# Patient Record
Sex: Male | Born: 1995 | Race: Black or African American | Hispanic: No | Marital: Single | Smoking: Never smoker
Health system: Southern US, Community
[De-identification: ages and names within clinical notes are randomized; demographics above are authoritative.]

---
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
## 2015-05-29 ENCOUNTER — Emergency Department (HOSPITAL_COMMUNITY)
Admission: EM | Admit: 2015-05-29 | Discharge: 2015-05-29 | Disposition: A | Discharge status: Home / Self Care | Payer: BLUE CROSS/BLUE SHIELD | Attending: Emergency Medicine | Admitting: Emergency Medicine

## 2015-05-29 ENCOUNTER — Encounter (HOSPITAL_COMMUNITY): Payer: Self-pay | Admitting: Emergency Medicine

## 2015-05-29 DIAGNOSIS — N390 Urinary tract infection, site not specified: Secondary | ICD-10-CM | POA: Diagnosis not present

## 2015-05-29 DIAGNOSIS — R319 Hematuria, unspecified: Secondary | ICD-10-CM | POA: Diagnosis present

## 2015-05-29 LAB — URINALYSIS, ROUTINE W REFLEX MICROSCOPIC
Bilirubin Urine: NEGATIVE
Glucose, UA: NEGATIVE mg/dL
Ketones, ur: 15 mg/dL — AB
Nitrite: NEGATIVE
Protein, ur: 30 mg/dL — AB
Specific Gravity, Urine: 1.029 (ref 1.005–1.030)
Urobilinogen, UA: 1 mg/dL (ref 0.0–1.0)
pH: 6 (ref 5.0–8.0)

## 2015-05-29 LAB — I-STAT CHEM 8, ED
BUN: 15 mg/dL (ref 6–20)
Calcium, Ion: 1.3 mmol/L — ABNORMAL HIGH (ref 1.12–1.23)
Chloride: 101 mmol/L (ref 101–111)
Creatinine, Ser: 0.9 mg/dL (ref 0.61–1.24)
Glucose, Bld: 85 mg/dL (ref 65–99)
HCT: 47 % (ref 39.0–52.0)
Hemoglobin: 16 g/dL (ref 13.0–17.0)
Potassium: 3.7 mmol/L (ref 3.5–5.1)
Sodium: 142 mmol/L (ref 135–145)
TCO2: 25 mmol/L (ref 0–100)

## 2015-05-29 LAB — URINE MICROSCOPIC-ADD ON

## 2015-05-29 MED ORDER — NITROFURANTOIN MONOHYD MACRO 100 MG PO CAPS: 100.0000 mg | ORAL_CAPSULE | Freq: Two times a day (BID) | ORAL | Status: AC

## 2015-05-29 NOTE — ED Notes (Signed)
Patient transported to CT 

## 2015-05-29 NOTE — ED Provider Notes (Signed)
CSN: [id number]   Arrival date & time 05/29/15 1847  History  This chart was scribed for non-physician practitioner, Eyvonne Mechanic PA-C , working with Glynn Octave, MD by Bethel Born, ED Scribe. This patient was seen in room TR08C/TR08C and the patient's care was started at 8:00 PM.  Chief Complaint  Patient presents with  . Hematuria    HPI The history is provided by the patient. No language interpreter was used.   Mitchell Bennett is a 19 y.o. male who presents to the Emergency Department complaining of hematuria with onset this morning. He has had 2 episodes of bleeding. The first time he noticed blood when he shook the penis after urination. The second time he had more blood. Associated symptoms includes dysuria. Pt skates and falls frequently but denies any significantly hard falls recently or new back pain. Sexually active and variably uses protection. No fever, chills, nausea, vomiting, abdominal pain, difficulty with urination, or penile pain.   History reviewed. No pertinent past medical history.  History reviewed. No pertinent past surgical history.  No family history on file.  Social History  Substance Use Topics  . Smoking status: Passive Smoke Exposure - Never Smoker  . Smokeless tobacco: None  . Alcohol Use: No     Review of Systems  All other systems reviewed and are negative.   Home Medications   Prior to Admission medications   Medication Sig Start Date End Date Taking? Authorizing Provider  nitrofurantoin, macrocrystal-monohydrate, (MACROBID) 100 MG capsule Take 1 capsule (100 mg total) by mouth 2 (two) times daily. 05/29/15   Eyvonne Mechanic, PA-C    Allergies  Review of patient's allergies indicates no known allergies.  Triage Vitals: BP 142/75 mmHg  Pulse 75  Temp(Src) 97.5 F (36.4 C) (Oral)  Resp 14  Ht  (1.676 m)  Wt 160 lb (72.576 kg)  BMI 25.84 kg/m2  SpO2 98%  Physical Exam  Constitutional: He is oriented to person, place, and time. He  appears well-developed and well-nourished.  HENT:  Head: Normocephalic.  Eyes: EOM are normal.  Neck: Normal range of motion.  Pulmonary/Chest: Effort normal.  Abdominal: He exhibits no distension.  Musculoskeletal: Normal range of motion.  No CVA tenderness  Neurological: He is alert and oriented to person, place, and time.  Psychiatric: He has a normal mood and affect.  Nursing note and vitals reviewed.   ED Course  Procedures  Labs Review-  Labs Reviewed  URINALYSIS, ROUTINE W REFLEX MICROSCOPIC (NOT AT [facility]) - Abnormal; Notable for the following:    APPearance CLOUDY (*)    Hgb urine dipstick LARGE (*)    Ketones, ur 15 (*)    Protein, ur 30 (*)    Leukocytes, UA MODERATE (*)    All other components within normal limits  URINE MICROSCOPIC-ADD ON - Abnormal; Notable for the following:    Squamous Epithelial / LPF MANY (*)    Bacteria, UA FEW (*)    All other components within normal limits  I-STAT CHEM 8, ED - Abnormal; Notable for the following:    Calcium, Ion 1.30 (*)    All other components within normal limits  RPR  HIV ANTIBODY (ROUTINE TESTING)  GC/CHLAMYDIA PROBE AMP ([place]) NOT AT [facility]    Imaging Review No results found.  EKG Interpretation None    MDM  10:05 PM I re-evaluated the patient and provided an update on the results of his lab work.    Final diagnoses:  Urinary tract  infection with hematuria, site unspecified     Labs:UA, HIV antibody, RPR, I-stat chem 8- large hemoglobin, moderate leukocytes  Imaging:   Consults   Therapeutics: Nitrofurantoin  Assessment: Patient presents with blood in his urine. He reports this was evident with his first urination this morning, has not noticed that since then. Patient denies any urinary complaints this time. Patient did have blood in his urine, with other findings that indicate infection. Due to patient's sexual activity, I suggested prophylactic treatment for STDs with Rocephin and  azithromycin, and treatment for urinary tract infection. Patient reported that he would not accept treatment for STDs, would accept treatment for urinary tract infection. I informed him that he would benefit him to have reflected treatment at this time, but he would be contacted with any positive test results and directed for treatment at that time. He was given strict return precautions event new or worsening signs or symptoms presented. Patient was instructed follow-up with his primary care provider in 2 days for reevaluation. Patient verbalized understanding and agreement for today's plan and had no further questions or concerns at the time of discharge  Plan:  I personally performed the services described in this documentation, which was scribed in my presence. The recorded information has been reviewed and is accurate.      Eyvonne Mechanic, PA-C 05/30/15 1512  Glynn Octave, MD 05/30/15 1535

## 2015-05-29 NOTE — ED Notes (Signed)
Pt. reports hematuria and mild dysuria onset today , denies penile discharge , no fever or chills.

## 2015-05-29 NOTE — Discharge Instructions (Signed)
Please use and Robaxin as directed, please follow-up with your primary care provider in 3 days for reevaluation. Please return immediately if new or worsening signs or symptoms present. We contacted with your test results if they should be positive.

## 2015-05-30 LAB — HIV ANTIBODY (ROUTINE TESTING W REFLEX): HIV Screen 4th Generation wRfx: NONREACTIVE

## 2015-05-30 LAB — RPR: RPR Ser Ql: NONREACTIVE

## 2015-05-31 LAB — GC/CHLAMYDIA PROBE AMP (~~LOC~~) NOT AT ARMC
Chlamydia: POSITIVE — AB
Neisseria Gonorrhea: NEGATIVE

## 2015-06-01 ENCOUNTER — Telehealth (HOSPITAL_COMMUNITY): Payer: Self-pay

## 2015-06-01 NOTE — Telephone Encounter (Signed)
Results received from [place].  (+) Chlamydia  No antibiotic treatment or prescription given for STD.  Chart to MD office for review.  [facility] form attached. 

## 2015-06-03 ENCOUNTER — Telehealth (HOSPITAL_COMMUNITY): Payer: Self-pay | Admitting: Emergency Medicine

## 2015-06-03 NOTE — Telephone Encounter (Signed)
Post ED Visit - Positive Culture Follow-up: Successful Patient Follow-Up  Positive Chlamydia culture   Patient discharged without antimicrobial prescription and treatment is now indicated  Organism is resistant to prescribed ED discharge antimicrobial  Patient with positive blood cultures  Changes discussed with ED provider: Mirian Mo, MD New antibiotic prescription: Azithromycin one gram PO x once  Will contact patient   Mitchell Bennett 06/03/2015, 5:37 PM

## 2015-06-04 ENCOUNTER — Telehealth (HOSPITAL_COMMUNITY): Payer: Self-pay | Admitting: *Deleted

## 2015-06-05 ENCOUNTER — Telehealth (HOSPITAL_BASED_OUTPATIENT_CLINIC_OR_DEPARTMENT_OTHER): Payer: Self-pay | Admitting: Emergency Medicine

## 2015-06-28 ENCOUNTER — Telehealth (HOSPITAL_COMMUNITY): Payer: Self-pay

## 2015-06-28 NOTE — Telephone Encounter (Signed)
Unable to contact pt by mail or telephone. Unable to communicate lab results or treatment changes. 

## 2017-01-10 ENCOUNTER — Encounter (HOSPITAL_COMMUNITY): Payer: Self-pay | Admitting: *Deleted

## 2017-01-10 ENCOUNTER — Ambulatory Visit (HOSPITAL_COMMUNITY)
Admission: EM | Admit: 2017-01-10 | Discharge: 2017-01-10 | Disposition: A | Discharge status: Home / Self Care | Payer: Medicaid Other | Attending: Internal Medicine | Admitting: Internal Medicine

## 2017-01-10 DIAGNOSIS — A084 Viral intestinal infection, unspecified: Secondary | ICD-10-CM

## 2017-01-10 NOTE — Discharge Instructions (Signed)
Go to ER if you starts to throw up blood again. Otherwise I believe you will be fine. Stay well hydrated.

## 2017-01-10 NOTE — ED Triage Notes (Signed)
Pt   Reports     Several  Days  Of   Some  Abdominal  Pain   Had  Some  Vomiting   Which  Was  Blood  Tinged   As   Well  As  Diarrhea   The   Symptoms  Let  Up  And  He  Has  None  Today      He  Is  Able  To  Hold  Down liquids

## 2017-01-10 NOTE — ED Provider Notes (Signed)
CSN: [id number]     Arrival date & time 01/10/17  1552 History   First MD Initiated Contact with Patient 01/10/17 1648     Chief Complaint  Patient presents with  . Abdominal Pain   (Consider location/radiation/quality/duration/timing/severity/associated sxs/prior Treatment) Patient is a healthy 21 y.o. Male, is here for one episode of hematemesis on Thursday night. He reports that he got sick on Thursday with abdominal pain, nausea, vomiting and diarrhea. He reports he had several episodes of vomiting but only 1 episode where it looked "red and bloody" but the condition resolved spontaneously with no subsequent episodes. He denies eating or drinking anything that is brown or red. He denies blood clots in the emesis.  He reports feeling better today. He is still nauseous but tolerable. Diarrhea also resolved. He denies dizziness, fatigue, SOB, CP, or headache.   Zofran was offered but patient declined.         History reviewed. No pertinent past medical history. History reviewed. No pertinent surgical history. History reviewed. No pertinent family history. Social History  Substance Use Topics  . Smoking status: Passive Smoke Exposure - Never Smoker  . Smokeless tobacco: Not on file  . Alcohol use No    Review of Systems  Constitutional:       As stated in the HPI    Allergies  Patient has no known allergies.  Home Medications   Prior to Admission medications   Medication Sig Start Date End Date Taking? Authorizing Provider  nitrofurantoin, macrocrystal-monohydrate, (MACROBID) 100 MG capsule Take 1 capsule (100 mg total) by mouth 2 (two) times daily. 05/29/15   Eyvonne MechanicJeffrey Hedges, PA-C   Meds Ordered and Administered this Visit  Medications - No data to display  BP 120/72 (BP Location: Right Arm)   Pulse 80   Temp 98.6 F (37 C) (Oral)   Resp 18   SpO2 100%  No data found.   Physical Exam  Constitutional: He is oriented to person, place, and time. He appears  well-developed and well-nourished.  HENT:  Head: Normocephalic and atraumatic.  Eyes: Pupils are equal, round, and reactive to light.  Neck: Normal range of motion.  Cardiovascular: Normal rate, regular rhythm and normal heart sounds.   Pulmonary/Chest: Effort normal and breath sounds normal.  Abdominal: Soft. Bowel sounds are normal. He exhibits no distension. There is no tenderness.  Neurological: He is oriented to person, place, and time.  Skin: Skin is warm and dry. No pallor.  Nursing note and vitals reviewed.   Urgent Care Course     Procedures (including critical care time)  Labs Review Labs Reviewed - No data to display  Imaging Review No results found.   MDM   1. Viral gastroenteritis    Physical examination unremarkable with no red flags noted. Patient appears well. Vital Signs are appropriate. Patient most likely had an viral gastroenteritis that is improving. Educated to stay well hydrated. Zofran rx was declined by the patient. I feel comfortable sending patent home but strict ER precaution were discussed.     Lucia EstelleFeng Floy Angert, NP 01/10/17 1708

## 2017-02-21 ENCOUNTER — Ambulatory Visit (INDEPENDENT_AMBULATORY_CARE_PROVIDER_SITE_OTHER): Payer: Self-pay

## 2017-02-21 ENCOUNTER — Ambulatory Visit (HOSPITAL_COMMUNITY)
Admission: EM | Admit: 2017-02-21 | Discharge: 2017-02-21 | Disposition: A | Discharge status: Home / Self Care | Payer: Self-pay | Attending: Internal Medicine | Admitting: Internal Medicine

## 2017-02-21 ENCOUNTER — Encounter (HOSPITAL_COMMUNITY): Payer: Self-pay | Admitting: *Deleted

## 2017-02-21 DIAGNOSIS — S60221A Contusion of right hand, initial encounter: Secondary | ICD-10-CM

## 2017-02-21 DIAGNOSIS — W228XXA Striking against or struck by other objects, initial encounter: Secondary | ICD-10-CM

## 2017-02-21 MED ORDER — IBUPROFEN 800 MG PO TABS: 800.0000 mg | ORAL_TABLET | Freq: Three times a day (TID) | ORAL | 0 refills | Status: AC

## 2017-02-21 NOTE — Discharge Instructions (Signed)
Apply ice, elevate, any changes in blood flow go to the nearest emergency room

## 2017-02-21 NOTE — ED Provider Notes (Signed)
CSN: [id number]     Arrival date & time 02/21/17  1757 History   First MD Initiated Contact with Patient 02/21/17 1852     Chief Complaint  Patient presents with  . Hand Injury   (Consider location/radiation/quality/duration/timing/severity/associated sxs/prior Treatment) [age] y.o. male presents with injury to right hand that occurred today when he punched a wall. Patient states that he dented the wall and hit the stud. Condition is acute  in nature. Condition is made better by nothing. Condition is made worse by nothing. Patient denies any treatmen prior to there arrival at this facility. Cap refill < 2, skin warm and dry. Patient has full ROM with increased pain when closing his fist. Patient has a abrasion over middle PIP. Swelling noted to posterior        History reviewed. No pertinent past medical history. History reviewed. No pertinent surgical history. History reviewed. No pertinent family history. Social History  Substance Use Topics  . Smoking status: Never Smoker  . Smokeless tobacco: Not on file  . Alcohol use No    Review of Systems  Allergies  Patient has no known allergies.  Home Medications   Prior to Admission medications   Medication Sig Start Date End Date Taking? Authorizing Provider  nitrofurantoin, macrocrystal-monohydrate, (MACROBID) 100 MG capsule Take 1 capsule (100 mg total) by mouth 2 (two) times daily. 05/29/15   Eyvonne MechanicHedges, Jeffrey, PA-C   Meds Ordered and Administered this Visit  Medications - No data to display  BP (!) 143/80   Pulse 63   Temp 97.9 F (36.6 C) (Oral)   Resp 16   SpO2 96%  No data found.   Physical Exam  Urgent Care Course     Procedures (including critical care time)  Labs Review Labs Reviewed - No data to display  Imaging Review No results found.       MDM  No diagnosis found.     Alene Miresmohundro, Anne Boltz C, NP 02/21/17 1931

## 2017-02-21 NOTE — ED Triage Notes (Signed)
Reports punching wall with right hand earlier today, hitting a stud behind drywall.  Right had swollen; c/o "clicking noise" in proximal hand with movement.  All fingers warm, pink, with prompt cap refill.

## 2017-03-28 ENCOUNTER — Encounter (HOSPITAL_COMMUNITY): Payer: Self-pay | Admitting: Emergency Medicine

## 2017-03-28 ENCOUNTER — Emergency Department (HOSPITAL_COMMUNITY)
Admission: EM | Admit: 2017-03-28 | Discharge: 2017-03-29 | Disposition: A | Discharge status: Home / Self Care | Payer: Self-pay | Attending: Physician Assistant | Admitting: Physician Assistant

## 2017-03-28 DIAGNOSIS — W2201XD Walked into wall, subsequent encounter: Secondary | ICD-10-CM | POA: Insufficient documentation

## 2017-03-28 DIAGNOSIS — M79641 Pain in right hand: Secondary | ICD-10-CM

## 2017-03-28 DIAGNOSIS — S6991XD Unspecified injury of right wrist, hand and finger(s), subsequent encounter: Secondary | ICD-10-CM | POA: Insufficient documentation

## 2017-03-28 NOTE — ED Triage Notes (Signed)
Pt c.o 8/10 right hand pain, pt states he was seen on UC 3 weeks ago for a hand injury after he punch a wall, no fx showing on the x ray, but pt states hand is getting more swollen and painful. No deformity noticed.

## 2017-03-29 ENCOUNTER — Emergency Department (HOSPITAL_COMMUNITY): Payer: Self-pay

## 2017-03-29 NOTE — ED Notes (Signed)
Patient transported to X-ray 

## 2017-03-29 NOTE — ED Notes (Signed)
Pt departed in NAD, refused use of wheelchair.  

## 2017-03-29 NOTE — Discharge Instructions (Signed)
Used brace or Ace wrap on affected area. Continue ibuprofen or Aleve as needed for swelling and pain. Follow-up with hand specialist listed below for further evaluation. Return to ED for additional injury, numbness, weakness of extremity, warmth of the area, fevers.

## 2017-03-29 NOTE — ED Provider Notes (Signed)
[facility]-EMERGENCY DEPT Provider Note   CSN: [id number] Arrival date & time: 03/28/17  2155  By signing my name below, I, Linna Darner, attest that this documentation has been prepared under the direction and in the presence of Rhyan Wolters, PA-C. Electronically Signed: Linna Darner, Scribe. 03/29/2017. 12:11 AM.  History   Chief Complaint Chief Complaint  Patient presents with  . Hand Pain    right   The history is provided by the patient. No language interpreter was used.    HPI Comments: Mitchell Bennett is a 21 y.o. male who presents to the Emergency Department complaining of persistent dorsal right hand pain and swelling for three weeks. Patient reports some associated bruising. He states he punched a wall three weeks ago and was evaluated at Urgent Care the following day. Patient's right hand x-ray was negative and he was discharged with a brace and ibuprofen. He has worn the brace and completed the prescription of ibuprofen as instructed without improvement of his symptoms. He has not been taking ibuprofen or any other medications over the last few days. Patient notes he recently fell on his right hand while skateboarding and his pain has been worse since that time. He states he cannot make a full fist with his right hand secondary to pain. No h/o right hand fractures or injuries. Patient denies numbness/tingling, open wounds, Temperature changes, fever, drainage or bleeding from site or any other associated symptoms.  History reviewed. No pertinent past medical history.  There are no active problems to display for this patient.   History reviewed. No pertinent surgical history.     Home Medications    Prior to Admission medications   Medication Sig Start Date End Date Taking? Authorizing Provider  ibuprofen (ADVIL,MOTRIN) 800 MG tablet Take 1 tablet (800 mg total) by mouth 3 (three) times daily. 02/21/17   Alene Mires, NP  nitrofurantoin, macrocrystal-monohydrate,  (MACROBID) 100 MG capsule Take 1 capsule (100 mg total) by mouth 2 (two) times daily. 05/29/15   Eyvonne Mechanic, PA-C    Family History History reviewed. No pertinent family history.  Social History Social History  Substance Use Topics  . Smoking status: Never Smoker  . Smokeless tobacco: Not on file  . Alcohol use No     Allergies   Patient has no known allergies.   Review of Systems Review of Systems  Constitutional: Negative for chills and fever.  Musculoskeletal: Positive for arthralgias and joint swelling.  Skin: Positive for color change. Negative for wound.  Neurological: Negative for syncope and numbness.   Physical Exam Updated Vital Signs BP (!) 145/93 (BP Location: Right Arm)   Pulse 76   Temp 98 F (36.7 C)   Resp 20   Ht 5\' 7"  (1.702 m)   Wt 74.8 kg (165 lb)   SpO2 99%   BMI 25.84 kg/m   Physical Exam  Constitutional: He appears well-developed and well-nourished. No distress.  HENT:  Head: Normocephalic and atraumatic.  Eyes: Conjunctivae and EOM are normal. No scleral icterus.  Neck: Normal range of motion.  Pulmonary/Chest: Effort normal. No respiratory distress.  Musculoskeletal:  Tenderness to palpation of the third and fourth MCP joints on the right. Mild swelling present. Pain with flexion of the digits. No palpable deformity. No temperature change or color change noted. Full active ROM of the wrist.   Neurological: He is alert.  Skin: No rash noted. He is not diaphoretic.  Psychiatric: He has a normal mood and affect.  Nursing note  and vitals reviewed.     ED Treatments / Results  Labs (all labs ordered are listed, but only abnormal results are displayed) Labs Reviewed - No data to display  EKG  EKG Interpretation None       Radiology Dg Hand Complete Right  Result Date: 03/29/2017 CLINICAL DATA:  Right hand pain after injury. Punched a wall 1 month prior. Fall several days ago with increased pain. EXAM: RIGHT HAND - COMPLETE  3+ VIEW COMPARISON:  Radiographs 02/21/2017 FINDINGS: There is no evidence of fracture or dislocation. There is no evidence of arthropathy or other focal bone abnormality. Soft tissues are unremarkable. IMPRESSION: Negative radiographs of the right hand. Electronically Signed   By: Rubye OaksMelanie  Ehinger M.D.   On: 03/29/2017 01:06    Procedures Procedures (including critical care time)  DIAGNOSTIC STUDIES: Oxygen Saturation is 99% on RA, normal by my interpretation.    COORDINATION OF CARE: 12:09 AM Discussed treatment plan with pt at bedside and pt agreed to plan.  Medications Ordered in ED Medications - No data to display   Initial Impression / Assessment and Plan / ED Course  I have reviewed the triage vital signs and the nursing notes.  Pertinent labs & imaging results that were available during my care of the patient were reviewed by me and considered in my medical decision making (see chart for details).     Patient presents to the ED for right hand pain. He states that the symptoms first began 3 weeks ago when he punched a wall. He states that he was seen at urgent care and told he had negative x-rays, given a brace and told to take ibuprofen. He states that symptoms have persisted since then. 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  X-rays obtained here were negative for fracture dislocation. Area is mildly swollen with some tenderness to palpation but no concern for septic joint at this time. There is no temperature or color change noted in the area. Patient is afebrile with no history of fever. He has full active and passive range of motion of the fingers although some pain with making a full fist. I advised him that he should continue wearing the brace or Ace wrap the area and continue taking ibuprofen or Aleve as needed for pain. I advised him that he needs to follow-up with a hand specialist whose  information we will provide today for further evaluation. No need for further imaging or testing at this time. Strict return precautions given.  Final Clinical Impressions(s) / ED Diagnoses   Final diagnoses:  Right hand pain    New Prescriptions Discharge Medication List as of 03/29/2017  1:19 AM     I personally performed the services described in this documentation, which was scribed in my presence. The recorded information has been reviewed and is accurate.    Dietrich PatesKhatri, Elanore Talcott, PA-C 03/29/17 0159    Abelino DerrickMackuen, Courteney Lyn, MD 03/30/17 [phone]

## 2018-02-05 IMAGING — DX DG HAND COMPLETE 3+V*R*
3 series · 3 of 3 positions shown · non-contrast
Comparison: None.

CLINICAL DATA: Acute right hand pain following punching injury
today. Initial encounter.

EXAM:
RIGHT HAND - COMPLETE 3+ VIEW

[hand pa]
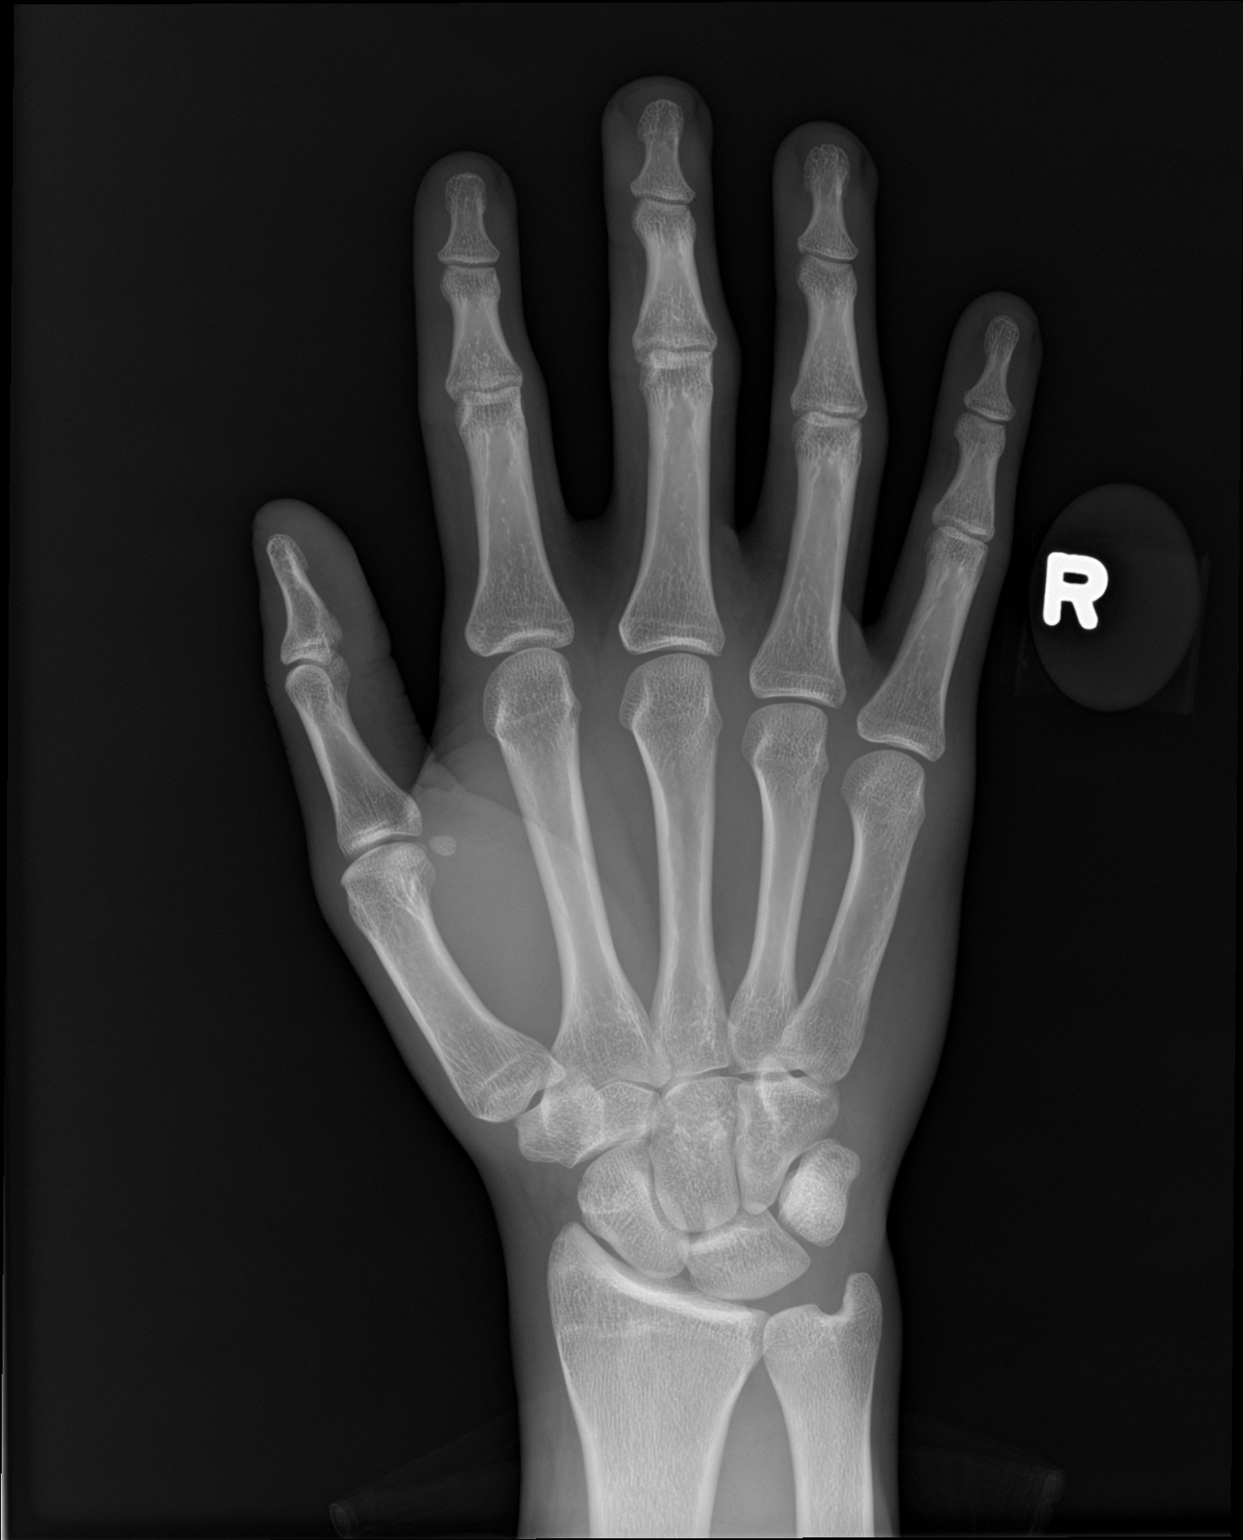

[hand obl]
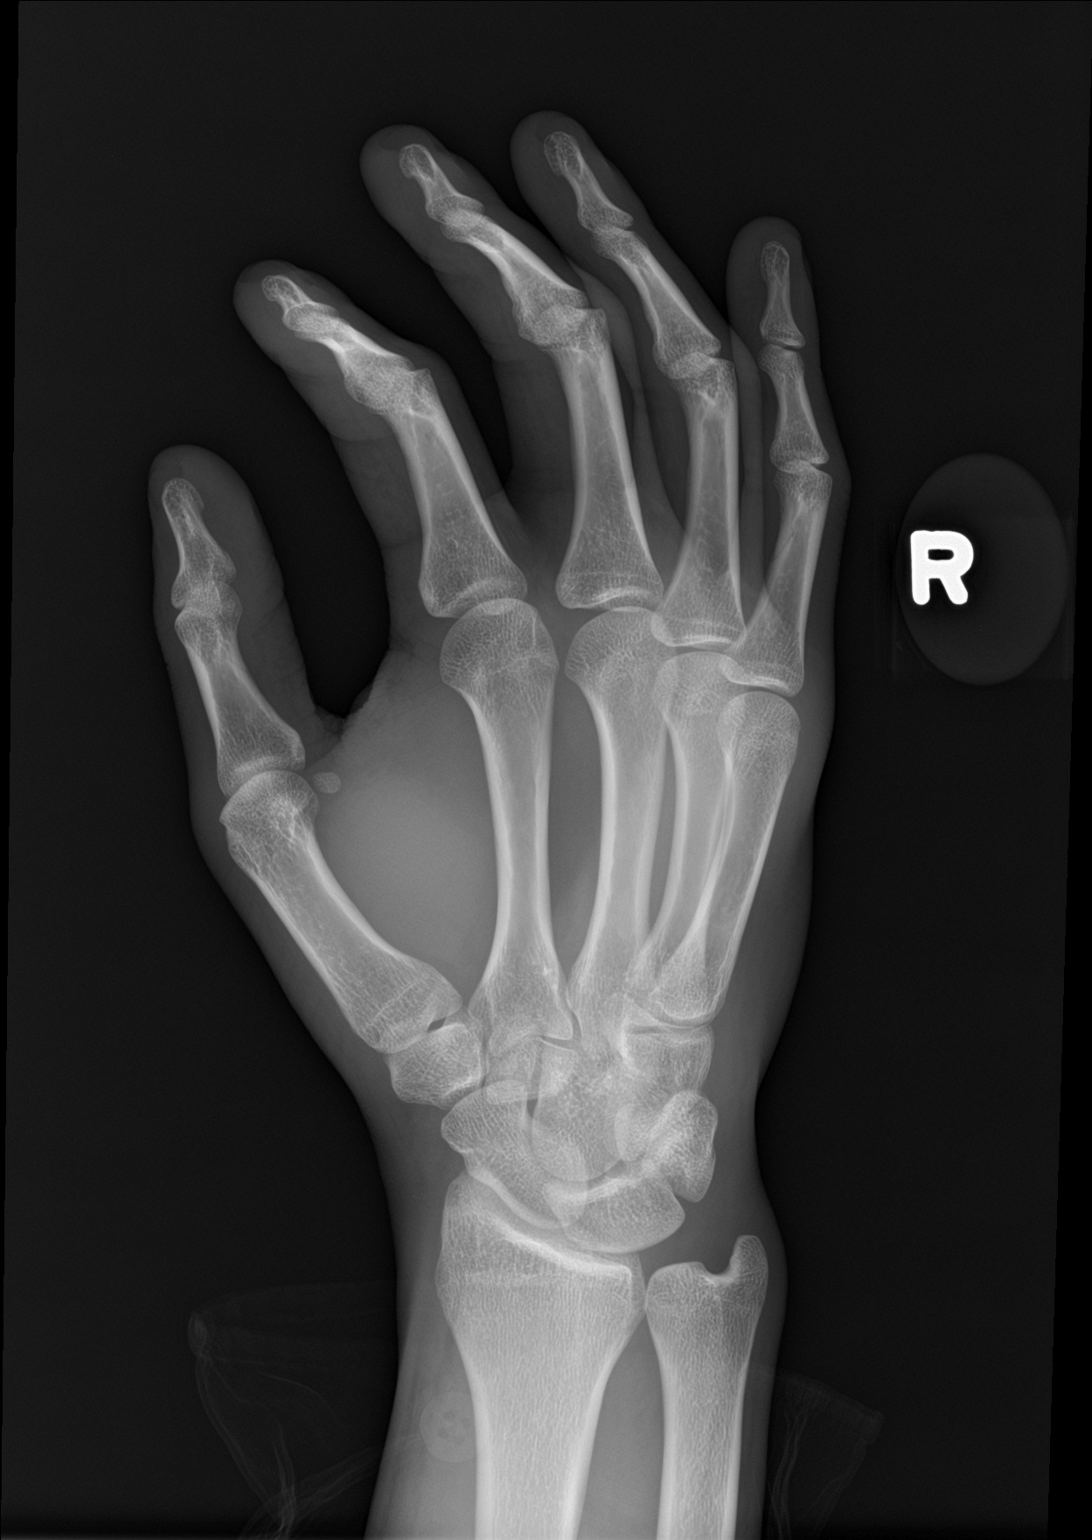

[hand lat]
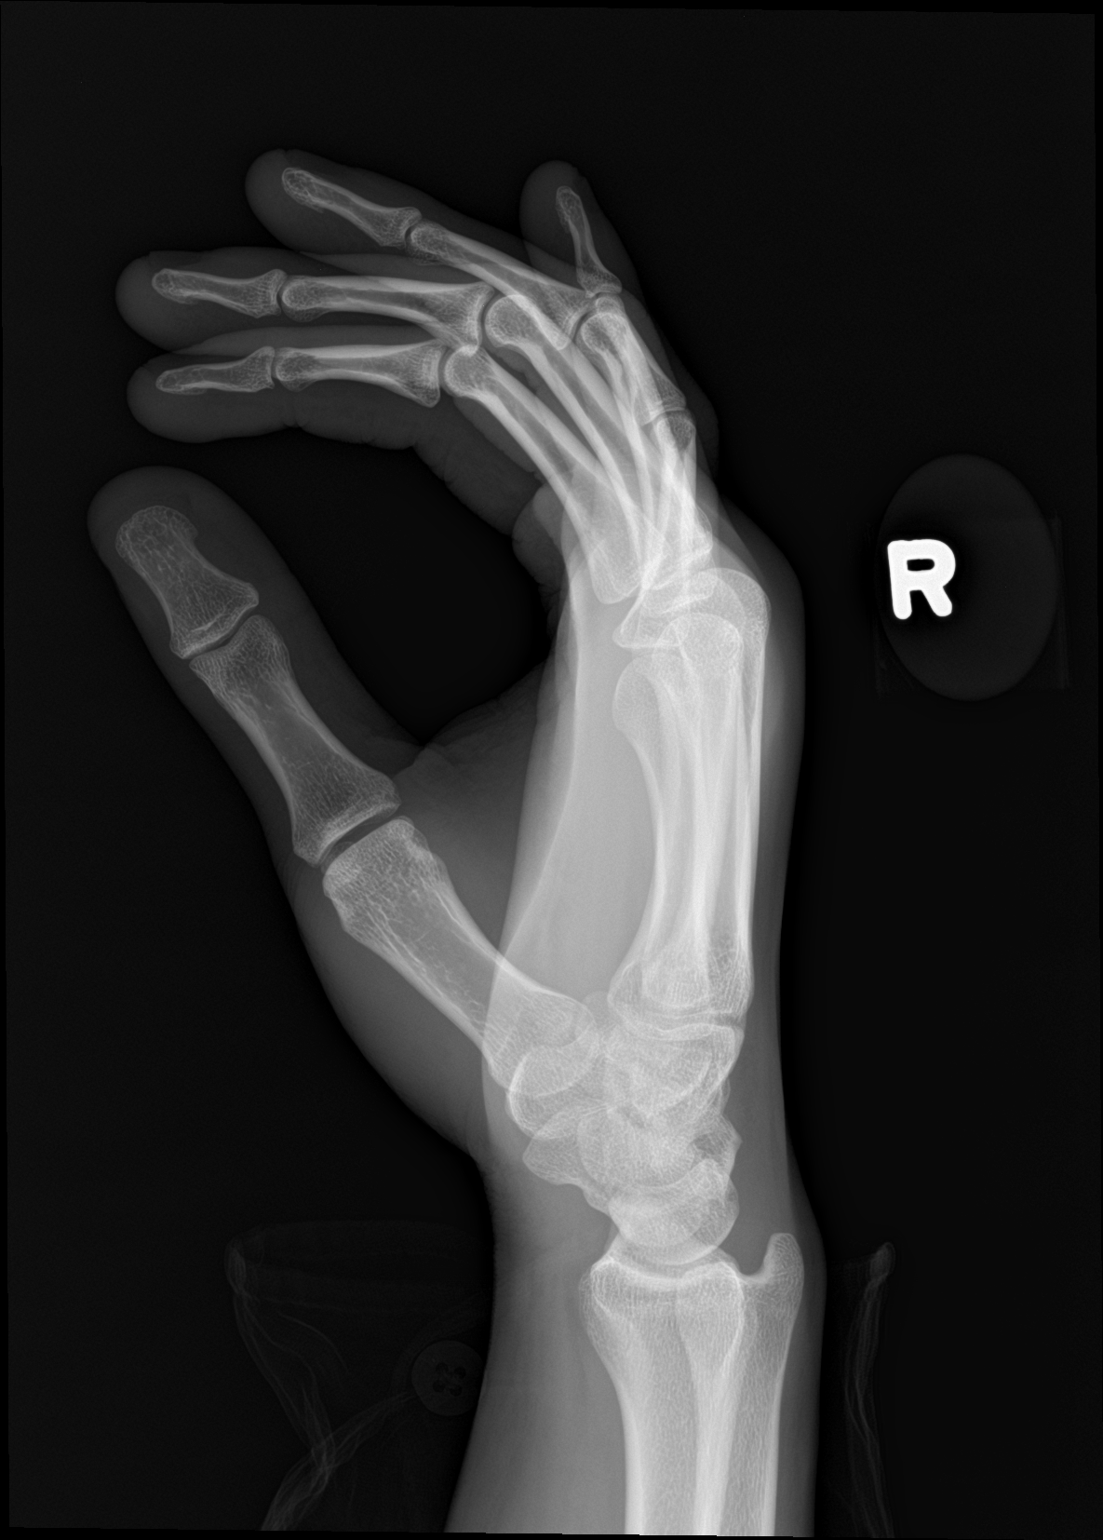

[3 of 3 positions shown; findings below may reference images not displayed]

FINDINGS: There is no evidence of acute fracture, subluxation or dislocation.

Mild dorsal soft tissue swelling is present.

No focal bony abnormalities are noted.
IMPRESSION: Mild soft tissue swelling without acute bony abnormality.

## 2018-03-13 IMAGING — DX DG HAND COMPLETE 3+V*R*
3 series · 3 of 3 positions shown · non-contrast
Comparison: Radiographs 02/21/2017

CLINICAL DATA: Right hand pain after injury. Punched a wall 1 month
prior. Fall several days ago with increased pain.

EXAM:
RIGHT HAND - COMPLETE 3+ VIEW

[hand pa]
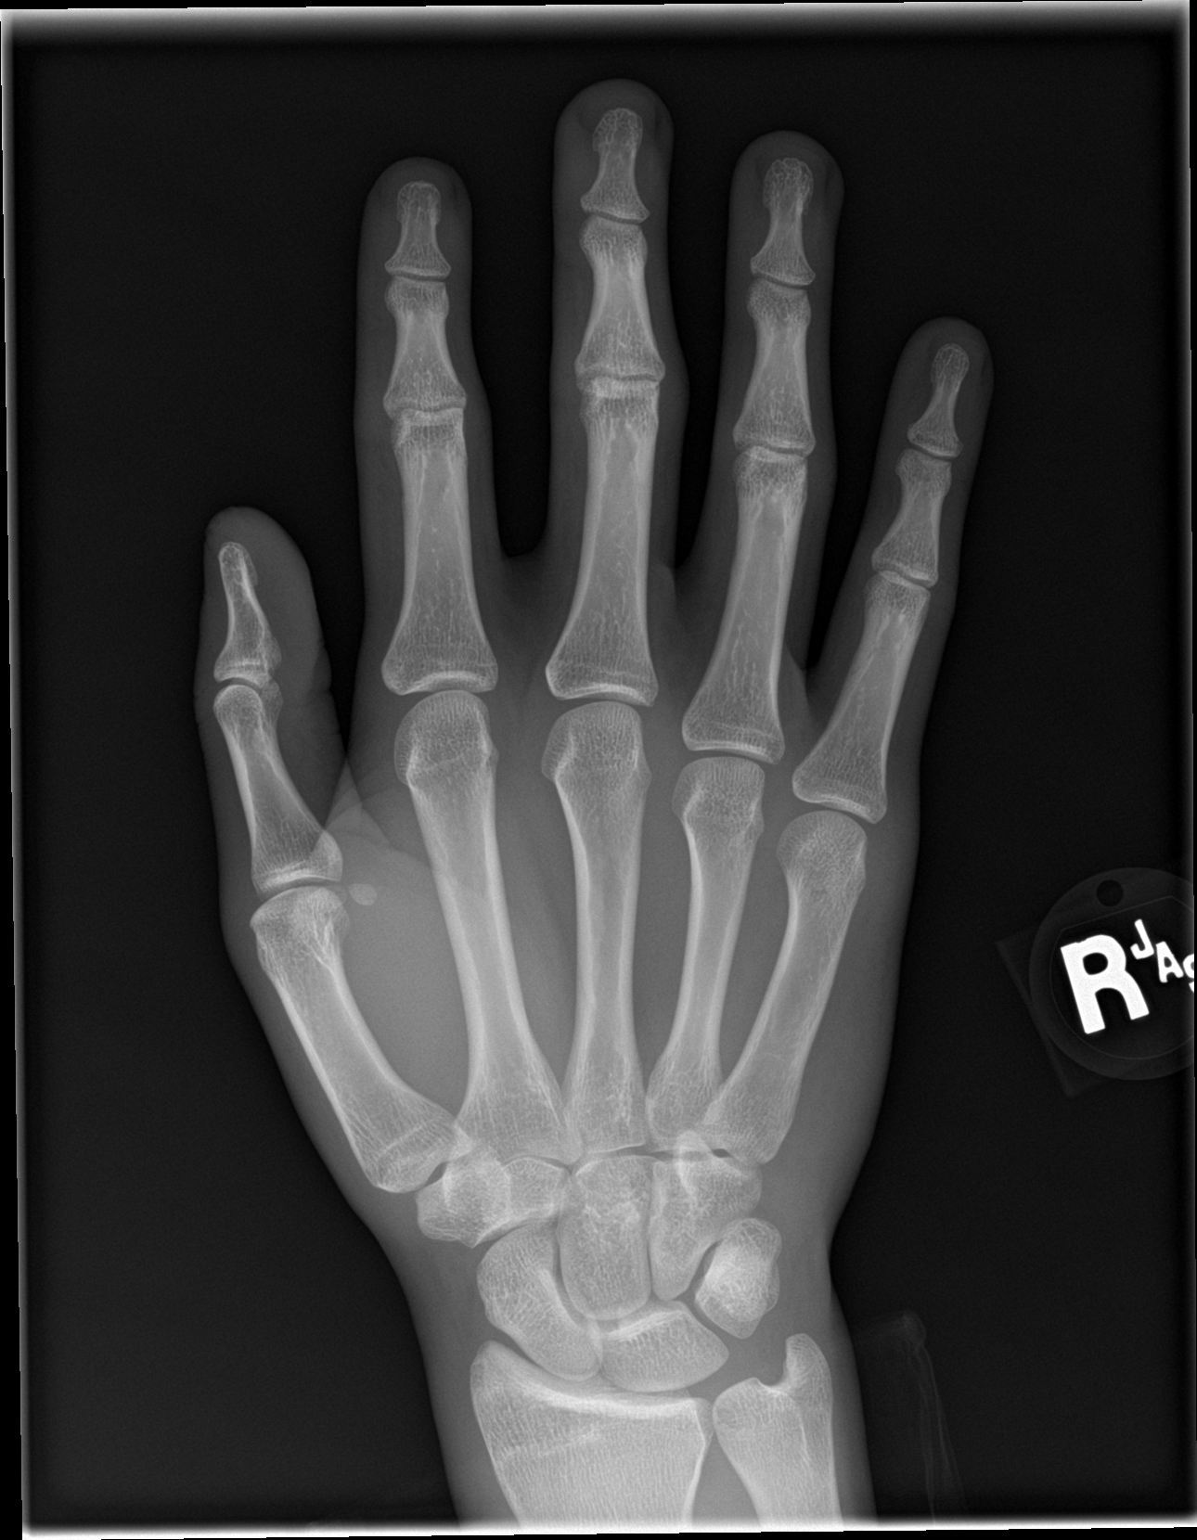

[hand obl]
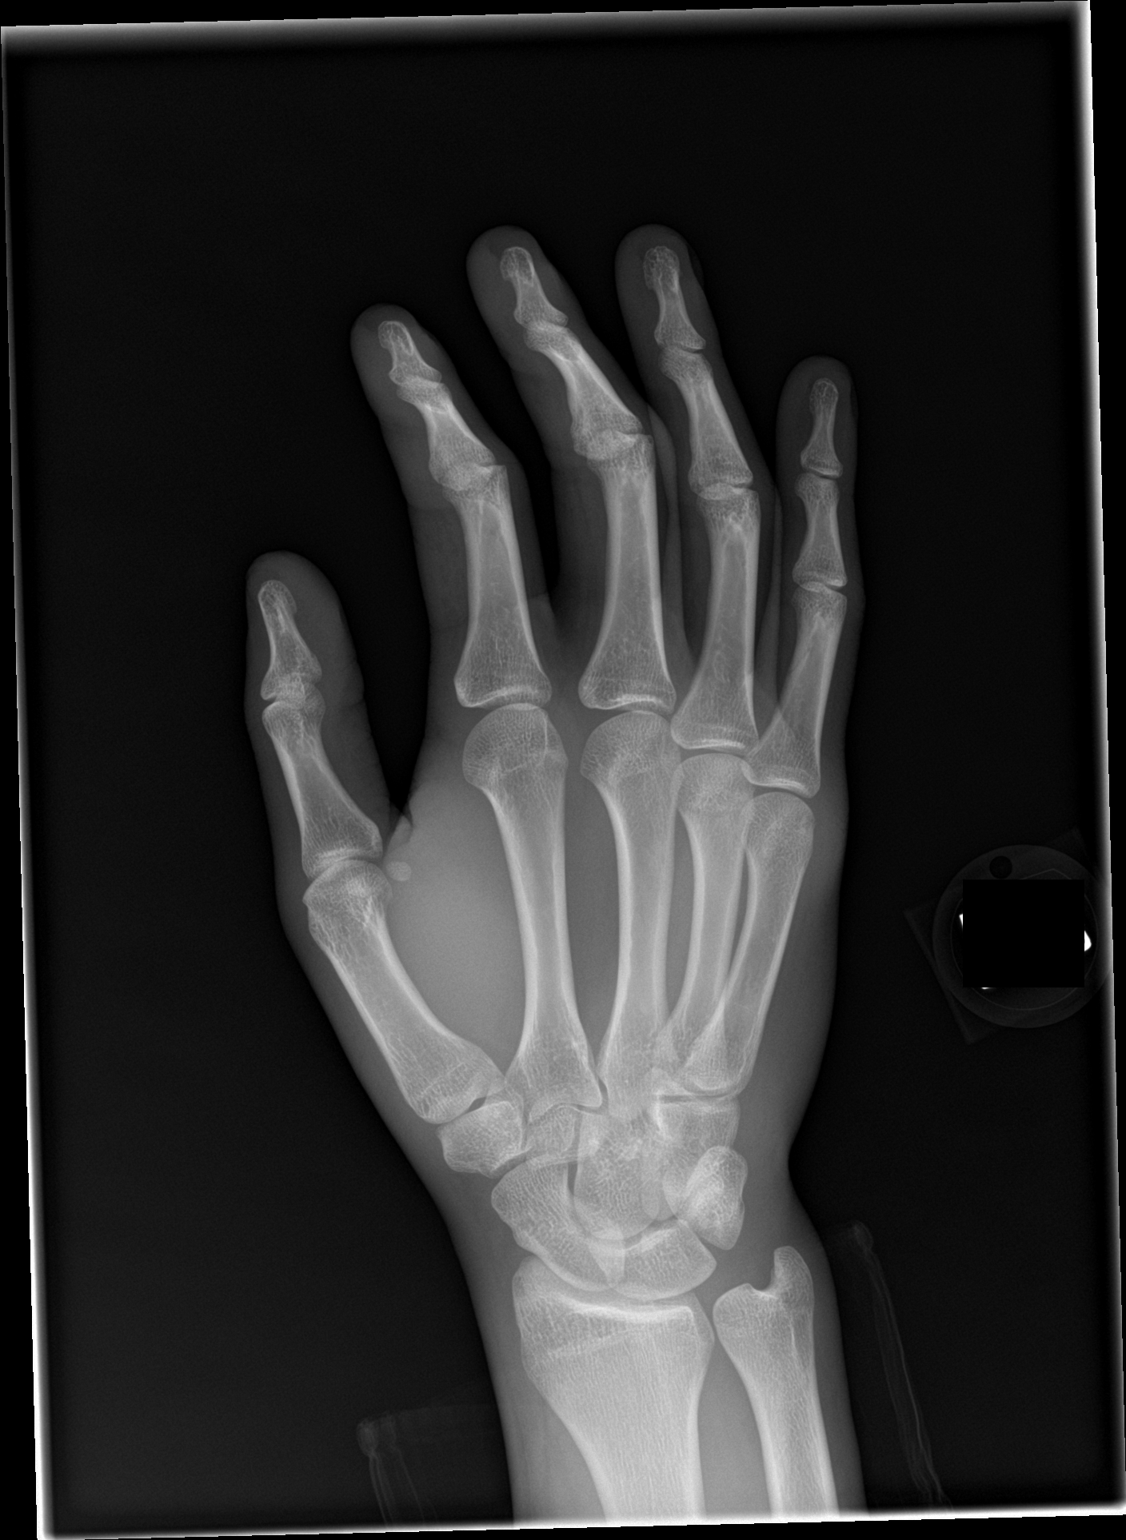

[hand lat]
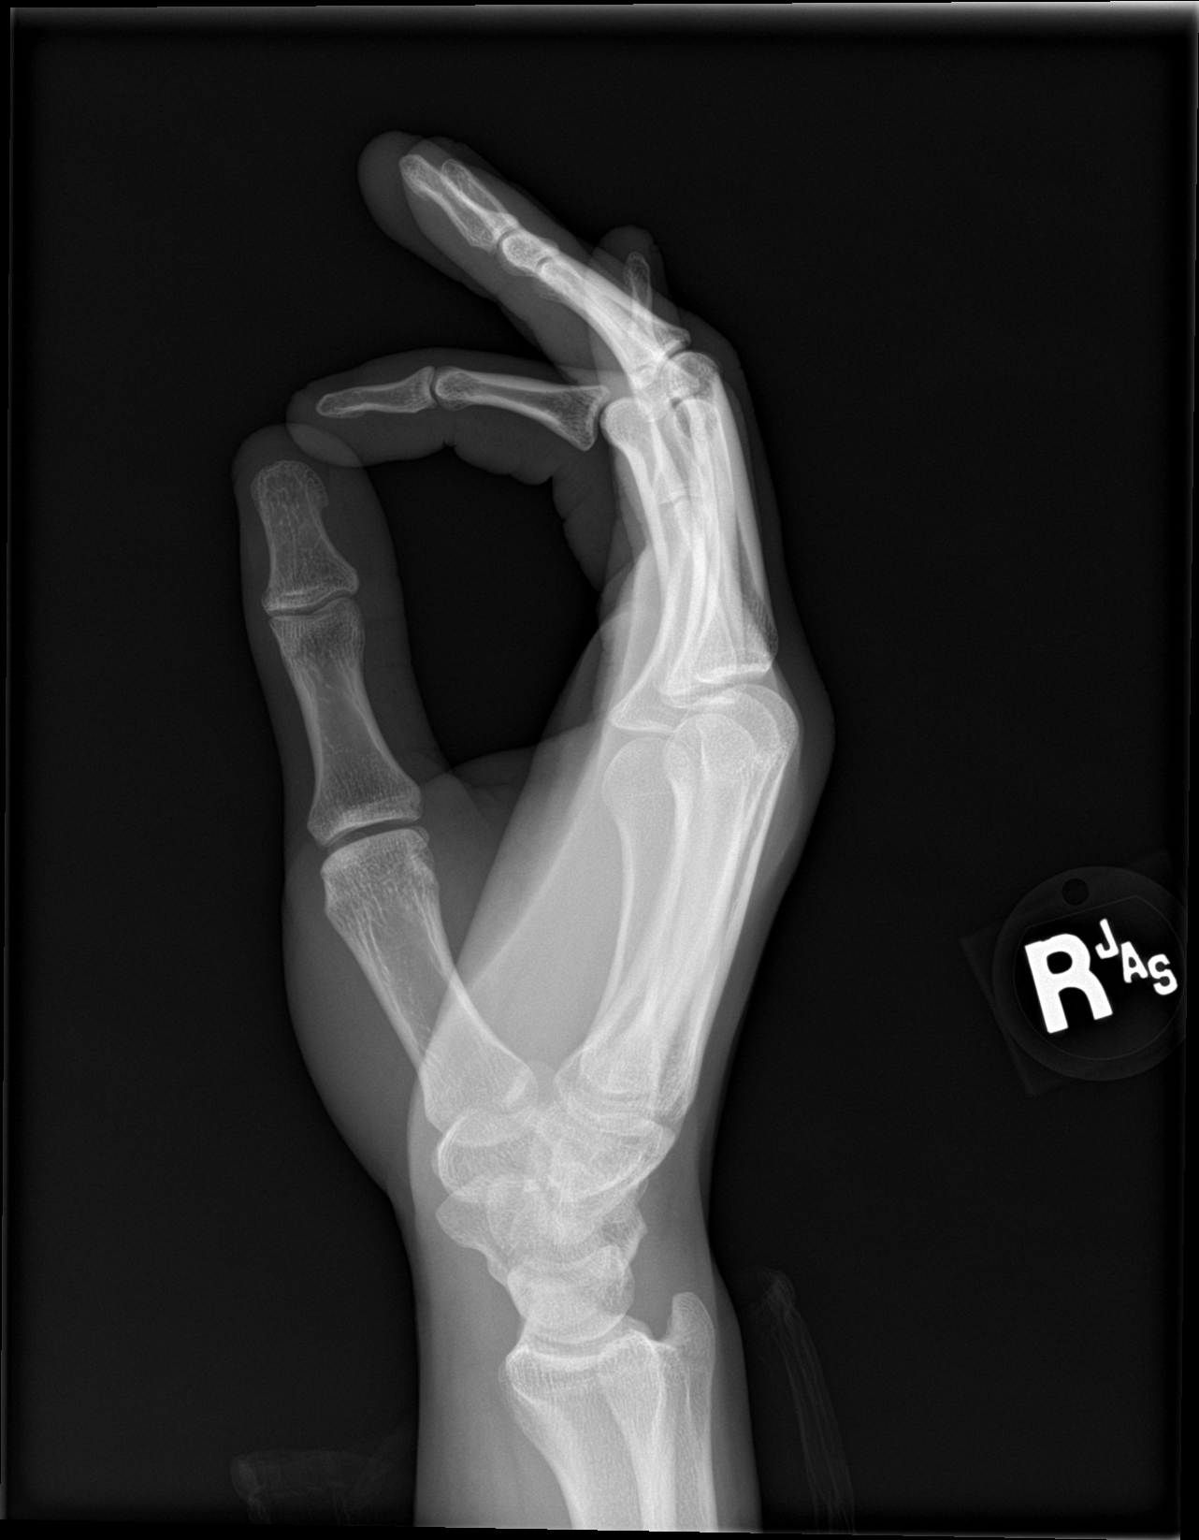

[3 of 3 positions shown; findings below may reference images not displayed]

FINDINGS: There is no evidence of fracture or dislocation. There is no
evidence of arthropathy or other focal bone abnormality. Soft
tissues are unremarkable.
IMPRESSION: Negative radiographs of the right hand.
